# Patient Record
Sex: Male | Born: 1995 | Race: White | Hispanic: No | Marital: Single | Smoking: Never smoker
Health system: Southern US, Community
[De-identification: ages and names within clinical notes are randomized; demographics above are authoritative.]

## PROBLEM LIST (undated history)

## (undated) DIAGNOSIS — R011 Cardiac murmur, unspecified: Secondary | ICD-10-CM

## (undated) HISTORY — DX: Cardiac murmur, unspecified: R01.1

## (undated) HISTORY — PX: NO PAST SURGERIES: SHX2092

---
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
## 2016-11-28 ENCOUNTER — Other Ambulatory Visit (INDEPENDENT_AMBULATORY_CARE_PROVIDER_SITE_OTHER): Payer: BLUE CROSS/BLUE SHIELD

## 2016-11-28 ENCOUNTER — Encounter: Payer: Self-pay | Admitting: Family Medicine

## 2016-11-28 ENCOUNTER — Ambulatory Visit (INDEPENDENT_AMBULATORY_CARE_PROVIDER_SITE_OTHER): Payer: BLUE CROSS/BLUE SHIELD | Admitting: Family Medicine

## 2016-11-28 VITALS — BP 122/62 | HR 91 | Temp 98.3°F | Ht 72.0 in | Wt 160.2 lb

## 2016-11-28 DIAGNOSIS — D696 Thrombocytopenia, unspecified: Secondary | ICD-10-CM

## 2016-11-28 LAB — CBC WITH DIFFERENTIAL/PLATELET
Basophils Absolute: 0 10*3/uL (ref 0.0–0.1)
Basophils Relative: 0.5 % (ref 0.0–3.0)
Eosinophils Absolute: 0.1 10*3/uL (ref 0.0–0.7)
Eosinophils Relative: 2.1 % (ref 0.0–5.0)
HCT: 47.8 % (ref 39.0–52.0)
Hemoglobin: 16 g/dL (ref 13.0–17.0)
Lymphocytes Relative: 30.7 % (ref 12.0–46.0)
Lymphs Abs: 1.2 10*3/uL (ref 0.7–4.0)
MCHC: 33.5 g/dL (ref 30.0–36.0)
MCV: 87.8 fl (ref 78.0–100.0)
Monocytes Absolute: 0.4 10*3/uL (ref 0.1–1.0)
Monocytes Relative: 10.6 % (ref 3.0–12.0)
Neutro Abs: 2.2 10*3/uL (ref 1.4–7.7)
Neutrophils Relative %: 56.1 % (ref 43.0–77.0)
Platelets: 124 10*3/uL — ABNORMAL LOW (ref 150.0–400.0)
RBC: 5.44 Mil/uL (ref 4.22–5.81)
RDW: 13 % (ref 11.5–14.6)
WBC: 3.9 10*3/uL — ABNORMAL LOW (ref 4.5–10.5)

## 2016-11-28 LAB — HEPATIC FUNCTION PANEL
ALT: 16 U/L (ref 0–53)
AST: 16 U/L (ref 0–37)
Albumin: 4.7 g/dL (ref 3.5–5.2)
Alkaline Phosphatase: 53 U/L (ref 39–117)
Bilirubin, Direct: 0.2 mg/dL (ref 0.0–0.3)
Total Bilirubin: 1 mg/dL (ref 0.2–1.2)
Total Protein: 6.9 g/dL (ref 6.0–8.3)

## 2016-11-28 LAB — BASIC METABOLIC PANEL
BUN: 18 mg/dL (ref 6–23)
CO2: 31 mEq/L (ref 19–32)
Calcium: 9.7 mg/dL (ref 8.4–10.5)
Chloride: 104 mEq/L (ref 96–112)
Creatinine, Ser: 1.05 mg/dL (ref 0.40–1.50)
GFR: 95.41 mL/min (ref >60.00)
Glucose, Bld: 99 mg/dL (ref 70–99)
Potassium: 4 mEq/L (ref 3.5–5.1)
Sodium: 141 mEq/L (ref 135–145)

## 2016-11-28 LAB — IRON: Iron: 118 ug/dL (ref 42–165)

## 2016-11-28 LAB — FERRITIN: Ferritin: 55.1 ng/mL (ref 22.0–322.0)

## 2016-11-28 NOTE — Progress Notes (Signed)
Pre visit review using our clinic review tool, if applicable. No additional management support is needed unless otherwise documented below in the visit note. 

## 2016-11-28 NOTE — Patient Instructions (Signed)
Give us 2-3 business days to get the results of your labs back.  Let us know if you need anything.  

## 2016-11-28 NOTE — Progress Notes (Signed)
Chief Complaint  Patient presents with  . Establish Care       New Patient Visit SUBJECTIVE: HPI: Adrian Perry is an 21 y.o.male who is being seen for establishing care.  Here w mom.   [facility] [place].  They drew numerous labs including a CMP, CBC, and lipid panel.  He had a bilirubin of 1.5, platelet count of 143, and calcium of 10.4, the upper limit of normal per their lab was 10.3.  He is a symptomatic.  He has no history of kidney stones, bleeding disorder, or areas of easy bruising/bleeding.   No Known Allergies  Past Medical History:  Diagnosis Date  . Heart murmur    Past Surgical History:  Procedure Laterality Date  . NO PAST SURGERIES       Social History   Social History  . Marital status: Single   Social History Main Topics  . Smoking status: Never Smoker  . Smokeless tobacco: Never Used  . Alcohol use Yes     Comment: rarely  . Drug use: No  . Sexual activity: No   Family History  Problem Relation Age of Onset  . Heart disease Maternal Grandmother   . Hypertension Maternal Grandmother   . Cancer Maternal Grandfather [id number]       colon  . Hypertension Maternal Grandfather   . Colon cancer Paternal Grandfather [id number]   ROS Cardiovascular: Denies chest pain  Heme: Denies easy bruising/bleeding   OBJECTIVE: BP 122/62 (BP Location: Left Arm, Patient Position: Sitting, Cuff Size: Normal)   Pulse 91   Temp 98.3 F (36.8 C) (Oral)   Ht 6' (1.829 m)   Wt 160 lb 4 oz (72.7 kg)   SpO2 97%   BMI 21.73 kg/m   Constitutional: -  VS reviewed -  Well developed, well nourished, appears stated age -  No apparent distress  Psychiatric: -  Oriented to person, place, and time -  Memory intact -  Affect and mood normal -  Fluent conversation, good eye contact -  Judgment and insight age appropriate  Eye: -  Conjunctivae clear, no discharge -  Pupils symmetric, round, reactive to light  ENMT: -  MMM    Pharynx moist, no exudate, no  erythema  Neck: -  No gross swelling, no palpable masses -  Thyroid midline, not enlarged, mobile, no palpable masses  Cardiovascular: -  RRR -  No LE edema  Respiratory: -  Normal respiratory effort, no accessory muscle use, no retraction -  Breath sounds equal, no wheezes, no ronchi, no crackles  Gastrointestinal: -  Bowel sounds normal -  No tenderness, no distention, no guarding, no masses  Skin: -  No significant lesion on inspection -  Warm and dry to palpation -  No icterus/jaundice   ASSESSMENT/PLAN: Thrombocytopenia (HCC) - Plan: CBC w/Diff  Hypocalcemia - Plan: Basic metabolic panel  Hyperbilirubinemia - Plan: Hepatic function panel  Recheck labs as above. Reassurance given.  Patient should return pending above. The patient and his mother voiced understanding and agreement to the plan.   Jilda Rocheicholas [place], DO 11/28/16  1:46 PM

## 2022-08-04 ENCOUNTER — Ambulatory Visit: Payer: BLUE CROSS/BLUE SHIELD | Admitting: Family Medicine
